# Patient Record
Sex: Female | Born: 1985 | Race: White | Hispanic: No | Marital: Single | Smoking: Never smoker
Health system: Southern US, Community
[De-identification: ages and names within clinical notes are randomized; demographics above are authoritative.]

## PROBLEM LIST (undated history)

## (undated) DIAGNOSIS — E079 Disorder of thyroid, unspecified: Secondary | ICD-10-CM

## (undated) DIAGNOSIS — F32A Depression, unspecified: Secondary | ICD-10-CM

## (undated) DIAGNOSIS — F319 Bipolar disorder, unspecified: Secondary | ICD-10-CM

## (undated) DIAGNOSIS — F419 Anxiety disorder, unspecified: Secondary | ICD-10-CM

## (undated) HISTORY — DX: Disorder of thyroid, unspecified: E07.9

## (undated) HISTORY — DX: Bipolar disorder, unspecified: F31.9

## (undated) HISTORY — DX: Anxiety disorder, unspecified: F41.9

## (undated) HISTORY — PX: DILATION AND CURETTAGE OF UTERUS: SHX78

## (undated) HISTORY — DX: Depression, unspecified: F32.A

## (undated) HISTORY — PX: GASTRIC BYPASS: SHX52

## (undated) HISTORY — PX: MANDIBLE FRACTURE SURGERY: SHX706

---

## 2020-12-03 ENCOUNTER — Encounter (HOSPITAL_COMMUNITY): Payer: Self-pay

## 2020-12-03 ENCOUNTER — Other Ambulatory Visit: Payer: Self-pay

## 2020-12-03 ENCOUNTER — Emergency Department (HOSPITAL_COMMUNITY)
Admission: EM | Admit: 2020-12-03 | Discharge: 2020-12-03 | Disposition: A | Discharge status: Home / Self Care | Payer: No Typology Code available for payment source | Attending: Emergency Medicine | Admitting: Emergency Medicine

## 2020-12-03 DIAGNOSIS — S39012A Strain of muscle, fascia and tendon of lower back, initial encounter: Secondary | ICD-10-CM

## 2020-12-03 DIAGNOSIS — S34109A Unspecified injury to unspecified level of lumbar spinal cord, initial encounter: Secondary | ICD-10-CM | POA: Diagnosis present

## 2020-12-03 DIAGNOSIS — S40011A Contusion of right shoulder, initial encounter: Secondary | ICD-10-CM | POA: Diagnosis not present

## 2020-12-03 DIAGNOSIS — Y92410 Unspecified street and highway as the place of occurrence of the external cause: Secondary | ICD-10-CM | POA: Diagnosis not present

## 2020-12-03 MED ORDER — ORPHENADRINE CITRATE ER 100 MG PO TB12: 100.0000 mg | ORAL_TABLET | Freq: Two times a day (BID) | ORAL | 0 refills | Status: AC
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
## 2020-12-06 ENCOUNTER — Ambulatory Visit: Payer: Self-pay

## 2020-12-06 ENCOUNTER — Emergency Department (INDEPENDENT_AMBULATORY_CARE_PROVIDER_SITE_OTHER)
Admission: EM | Admit: 2020-12-06 | Discharge: 2020-12-06 | Disposition: A | Discharge status: Home / Self Care | Payer: 59 | Source: Home / Self Care

## 2020-12-06 ENCOUNTER — Encounter: Payer: Self-pay | Admitting: Emergency Medicine

## 2020-12-06 ENCOUNTER — Other Ambulatory Visit: Payer: Self-pay

## 2020-12-06 ENCOUNTER — Emergency Department: Admit: 2020-12-06 | Discharge status: Absent without leave | Payer: Self-pay

## 2020-12-06 ENCOUNTER — Emergency Department (INDEPENDENT_AMBULATORY_CARE_PROVIDER_SITE_OTHER): Payer: 59

## 2020-12-06 DIAGNOSIS — H547 Unspecified visual loss: Secondary | ICD-10-CM | POA: Diagnosis not present

## 2020-12-06 DIAGNOSIS — R519 Headache, unspecified: Secondary | ICD-10-CM

## 2020-12-06 DIAGNOSIS — R410 Disorientation, unspecified: Secondary | ICD-10-CM | POA: Diagnosis not present

## 2020-12-06 DIAGNOSIS — S161XXA Strain of muscle, fascia and tendon at neck level, initial encounter: Secondary | ICD-10-CM

## 2020-12-06 MED ORDER — CYCLOBENZAPRINE HCL 10 MG PO TABS: 10.0000 mg | ORAL_TABLET | Freq: Two times a day (BID) | ORAL | 0 refills | Status: AC | PRN

## 2020-12-06 NOTE — Discharge Instructions (Addendum)
Your CT was negative today  You likely have a concussion  I have attached concussion information to this paperwork  Follow up with this office or with primary care if symptoms are persisting.  Follow up in the ER for high fever, trouble swallowing, trouble breathing, other concerning symptoms.
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
